# Patient Record
Sex: Female | Born: 2005 | Race: White | Hispanic: No | Marital: Single | Smoking: Never smoker
Health system: Southern US, Community
[De-identification: ages and names within clinical notes are randomized; demographics above are authoritative.]

## PROBLEM LIST (undated history)

## (undated) DIAGNOSIS — R519 Headache, unspecified: Secondary | ICD-10-CM

## (undated) HISTORY — DX: Headache, unspecified: R51.9

---
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
## 2023-12-18 ENCOUNTER — Other Ambulatory Visit (INDEPENDENT_AMBULATORY_CARE_PROVIDER_SITE_OTHER): Payer: Self-pay | Admitting: Pediatrics
